# Patient Record
Sex: Female | Born: 1940 | Race: Black or African American | Hispanic: No | Smoking: Never smoker
Health system: Southern US, Community
[De-identification: ages and names within clinical notes are randomized; demographics above are authoritative.]

## PROBLEM LIST (undated history)

## (undated) DIAGNOSIS — I10 Essential (primary) hypertension: Secondary | ICD-10-CM

## (undated) DIAGNOSIS — E119 Type 2 diabetes mellitus without complications: Secondary | ICD-10-CM

---

## 2015-04-18 ENCOUNTER — Emergency Department (HOSPITAL_COMMUNITY): Payer: Medicare Other

## 2015-04-18 ENCOUNTER — Encounter (HOSPITAL_COMMUNITY): Payer: Self-pay | Admitting: Emergency Medicine

## 2015-04-18 ENCOUNTER — Emergency Department (HOSPITAL_COMMUNITY)
Admission: EM | Admit: 2015-04-18 | Discharge: 2015-04-18 | Disposition: A | Discharge status: Home / Self Care | Payer: Medicare Other | Attending: Emergency Medicine | Admitting: Emergency Medicine

## 2015-04-18 DIAGNOSIS — J111 Influenza due to unidentified influenza virus with other respiratory manifestations: Secondary | ICD-10-CM

## 2015-04-18 DIAGNOSIS — E119 Type 2 diabetes mellitus without complications: Secondary | ICD-10-CM | POA: Diagnosis not present

## 2015-04-18 DIAGNOSIS — R05 Cough: Secondary | ICD-10-CM | POA: Diagnosis not present

## 2015-04-18 DIAGNOSIS — Z79899 Other long term (current) drug therapy: Secondary | ICD-10-CM | POA: Insufficient documentation

## 2015-04-18 DIAGNOSIS — R197 Diarrhea, unspecified: Secondary | ICD-10-CM | POA: Insufficient documentation

## 2015-04-18 DIAGNOSIS — R067 Sneezing: Secondary | ICD-10-CM | POA: Diagnosis not present

## 2015-04-18 DIAGNOSIS — R69 Illness, unspecified: Secondary | ICD-10-CM

## 2015-04-18 DIAGNOSIS — I10 Essential (primary) hypertension: Secondary | ICD-10-CM | POA: Insufficient documentation

## 2015-04-18 DIAGNOSIS — R509 Fever, unspecified: Secondary | ICD-10-CM | POA: Insufficient documentation

## 2015-04-18 DIAGNOSIS — R112 Nausea with vomiting, unspecified: Secondary | ICD-10-CM | POA: Insufficient documentation

## 2015-04-18 HISTORY — DX: Essential (primary) hypertension: I10

## 2015-04-18 HISTORY — DX: Type 2 diabetes mellitus without complications: E11.9

## 2015-04-18 LAB — COMPREHENSIVE METABOLIC PANEL
ALBUMIN: 4.3 g/dL (ref 3.5–5.0)
ALT: 15 U/L (ref 14–54)
ANION GAP: 9 (ref 5–15)
AST: 17 U/L (ref 15–41)
Alkaline Phosphatase: 76 U/L (ref 38–126)
BILIRUBIN TOTAL: 0.8 mg/dL (ref 0.3–1.2)
BUN: 14 mg/dL (ref 6–20)
CO2: 25 mmol/L (ref 22–32)
Calcium: 9.3 mg/dL (ref 8.9–10.3)
Chloride: 107 mmol/L (ref 101–111)
Creatinine, Ser: 1.01 mg/dL — ABNORMAL HIGH (ref 0.44–1.00)
GFR calc Af Amer: >60 mL/min (ref >60)
GFR calc non Af Amer: 53 mL/min — ABNORMAL LOW (ref >60)
GLUCOSE: 208 mg/dL — AB (ref 65–99)
POTASSIUM: 3.6 mmol/L (ref 3.5–5.1)
SODIUM: 141 mmol/L (ref 135–145)
TOTAL PROTEIN: 7.5 g/dL (ref 6.5–8.1)

## 2015-04-18 LAB — CBC
HEMATOCRIT: 36.3 % (ref 36.0–46.0)
HEMOGLOBIN: 11.7 g/dL — AB (ref 12.0–15.0)
MCH: 26.4 pg (ref 26.0–34.0)
MCHC: 32.2 g/dL (ref 30.0–36.0)
MCV: 81.9 fL (ref 78.0–100.0)
Platelets: 204 10*3/uL (ref 150–400)
RBC: 4.43 MIL/uL (ref 3.87–5.11)
RDW: 14 % (ref 11.5–15.5)
WBC: 8.8 10*3/uL (ref 4.0–10.5)

## 2015-04-18 LAB — URINE MICROSCOPIC-ADD ON

## 2015-04-18 LAB — URINALYSIS, ROUTINE W REFLEX MICROSCOPIC
BILIRUBIN URINE: NEGATIVE
Glucose, UA: 500 mg/dL — AB
Ketones, ur: 15 mg/dL — AB
Leukocytes, UA: NEGATIVE
NITRITE: NEGATIVE
PH: 5.5 (ref 5.0–8.0)
Protein, ur: NEGATIVE mg/dL
SPECIFIC GRAVITY, URINE: 1.016 (ref 1.005–1.030)

## 2015-04-18 LAB — LIPASE, BLOOD: Lipase: 20 U/L (ref 11–51)

## 2015-04-18 LAB — I-STAT CG4 LACTIC ACID, ED: Lactic Acid, Venous: 1.25 mmol/L (ref 0.5–2.0)

## 2015-04-18 MED ORDER — SODIUM CHLORIDE 0.9 % IV BOLUS (SEPSIS)
500.0000 mL | Freq: Once | INTRAVENOUS | Status: AC
  Administered 2015-04-18: 500 mL via INTRAVENOUS

## 2015-04-18 MED ORDER — ONDANSETRON 8 MG PO TBDP: 8.0000 mg | ORAL_TABLET | Freq: Three times a day (TID) | ORAL | Status: AC | PRN

## 2015-04-18 MED ORDER — ACETAMINOPHEN 325 MG PO TABS
650.0000 mg | ORAL_TABLET | Freq: Once | ORAL | Status: AC
  Administered 2015-04-18: 650 mg via ORAL
  Filled 2015-04-18: qty 2

## 2015-04-18 MED ORDER — HYDROCOD POLST-CPM POLST ER 10-8 MG/5ML PO SUER: 5.0000 mL | Freq: Two times a day (BID) | ORAL | Status: AC

## 2015-04-18 NOTE — ED Notes (Signed)
Patient cannot use restroom at this time, aware specimen is needed.

## 2015-04-18 NOTE — Discharge Instructions (Signed)

## 2015-04-18 NOTE — ED Provider Notes (Signed)
CSN: [id number]     Arrival date & time 04/18/15  [id number] History   First MD Initiated Contact with Patient 04/18/15 [phone]     Chief Complaint  Patient presents with  . Emesis     (Consider location/radiation/quality/duration/timing/severity/associated sxs/prior Treatment) HPI 74 year old female history of diabetes and hypertension presents today with 2 day history of nausea, vomiting, diarrhea, sneezing, cough with influenza-like illness. She reports that her symptoms began on Tuesday after her son symptoms began the day before. She has a history of diabetes and her blood sugars have been running "high" with this. She has no vomited since yesterday. Emesis appeared to be consistent with food recently eaten. She states that she had 2 loose stools there was no blood noted. She has had subjective fever. Today she has not vomited but continues to have nausea. Very little fluids due to the nausea. She feels somewhat dyspneic. She is visiting here from out of town. She has not had a flu shot this year. She denies smoking. Past Medical History  Diagnosis Date  . Diabetes mellitus without complication (HCC)   . Hypertension    Past Surgical History  Procedure Laterality Date  . Cesarean section     No family history on file. Social History  Substance Use Topics  . Smoking status: Never Smoker   . Smokeless tobacco: None  . Alcohol Use: No   OB History    No data available     Review of Systems  All other systems reviewed and are negative.     Allergies  Penicillins  Home Medications   Prior to Admission medications   Medication Sig Start Date End Date Taking? Authorizing Provider  acetaminophen (TYLENOL) 500 MG tablet Take 500 mg by mouth every 6 (six) hours as needed for moderate pain.   Yes Historical Provider, MD  glipiZIDE (GLUCOTROL XL) 10 MG 24 hr tablet Take 10 mg by mouth 2 (two) times daily. 03/07/15  Yes Historical Provider, MD  hydrochlorothiazide (HYDRODIURIL) 12.5 MG  tablet Take 12.5 mg by mouth daily. 03/22/15  Yes Historical Provider, MD   BP 139/73 mmHg  Pulse 90  Temp(Src) 102.4 F (39.1 C) (Oral)  Resp 18  SpO2 98% Physical Exam  Constitutional: She is oriented to person, place, and time. She appears well-nourished.  Febrile to 102.4  HENT:  Head: Normocephalic and atraumatic.  Right Ear: External ear normal.  Left Ear: External ear normal.  Nose: Nose normal.  Mouth/Throat: Oropharynx is clear and moist.  Eyes: Conjunctivae and EOM are normal. Pupils are equal, round, and reactive to light.  Neck: Normal range of motion. Neck supple.  Cardiovascular: Normal rate, regular rhythm, normal heart sounds and intact distal pulses.   Pulmonary/Chest: Effort normal and breath sounds normal.  Abdominal: Soft. Bowel sounds are normal.  Musculoskeletal: Normal range of motion.  Neurological: She is alert and oriented to person, place, and time. She has normal reflexes.  Skin: Skin is warm and dry.  Psychiatric: She has a normal mood and affect. Her behavior is normal. Judgment and thought content normal.  Nursing note and vitals reviewed.   ED Course  Procedures (including critical care time) Labs Review Labs Reviewed  CBC - Abnormal; Notable for the following:    Hemoglobin 11.7 (*)    All other components within normal limits  CULTURE, BLOOD (ROUTINE X 2)  CULTURE, BLOOD (ROUTINE X 2)  LIPASE, BLOOD  COMPREHENSIVE METABOLIC PANEL  URINALYSIS, ROUTINE W REFLEX MICROSCOPIC (NOT AT [facility])  I-STAT  CG4 LACTIC ACID, ED    Imaging Review Dg Chest Portable 1 View  04/18/2015  CLINICAL DATA:  Cough, fever nonsmoker EXAM: PORTABLE CHEST 1 VIEW COMPARISON:  None. FINDINGS: Cardiomediastinal silhouette is unremarkable. Mild upper thoracic dextroscoliosis. Mild degenerative changes thoracic spine. No acute infiltrate or pulmonary edema. IMPRESSION: No active disease.  Mild degenerative changes thoracic spine. Electronically Signed   By: Natasha Mead  M.D.   On: 04/18/2015 10:25   I have personally reviewed and evaluated these images and lab results as part of my medical decision-making.   EKG Interpretation   Date/Time:  Monday April 18 2015 10:41:24 EST Ventricular Rate:  82 PR Interval:    QRS Duration: 86 QT Interval:  301 QTC Calculation: 351 R Axis:   56 Text Interpretation:  Normal sinus rhythm Nonspecific ST abnormality  Confirmed by Alora Gorey MD, Makinzie Considine ([id number]) on 04/18/2015 10:46:17 AM      MDM   Final diagnoses:  Influenza-like illness    Temp down to 100.  Patient tolerating liquids well.  No infiltrate on cxr.  Plan antiemetics and cough medicine.  Patient voices understanding of return precautions and need for follow u.     Margarita Grizzle, MD 04/18/15 1225

## 2015-04-18 NOTE — ED Notes (Signed)
Bed: [id number] Expected date:  Expected time:  Means of arrival:  Comments: 74 y/o F EMS

## 2015-04-18 NOTE — ED Notes (Signed)
Per pt, states she went to a retirement party on Saturday-returned home and started feeling bad-vomiting, diarrhea

## 2015-04-23 LAB — CULTURE, BLOOD (ROUTINE X 2)
CULTURE: NO GROWTH
Culture: NO GROWTH

## 2016-08-12 IMAGING — CR DG CHEST 1V PORT
1 series · 1 of 1 positions shown · non-contrast
Comparison: None.

CLINICAL DATA: Cough, fever nonsmoker

EXAM:
PORTABLE CHEST 1 VIEW

[AP]
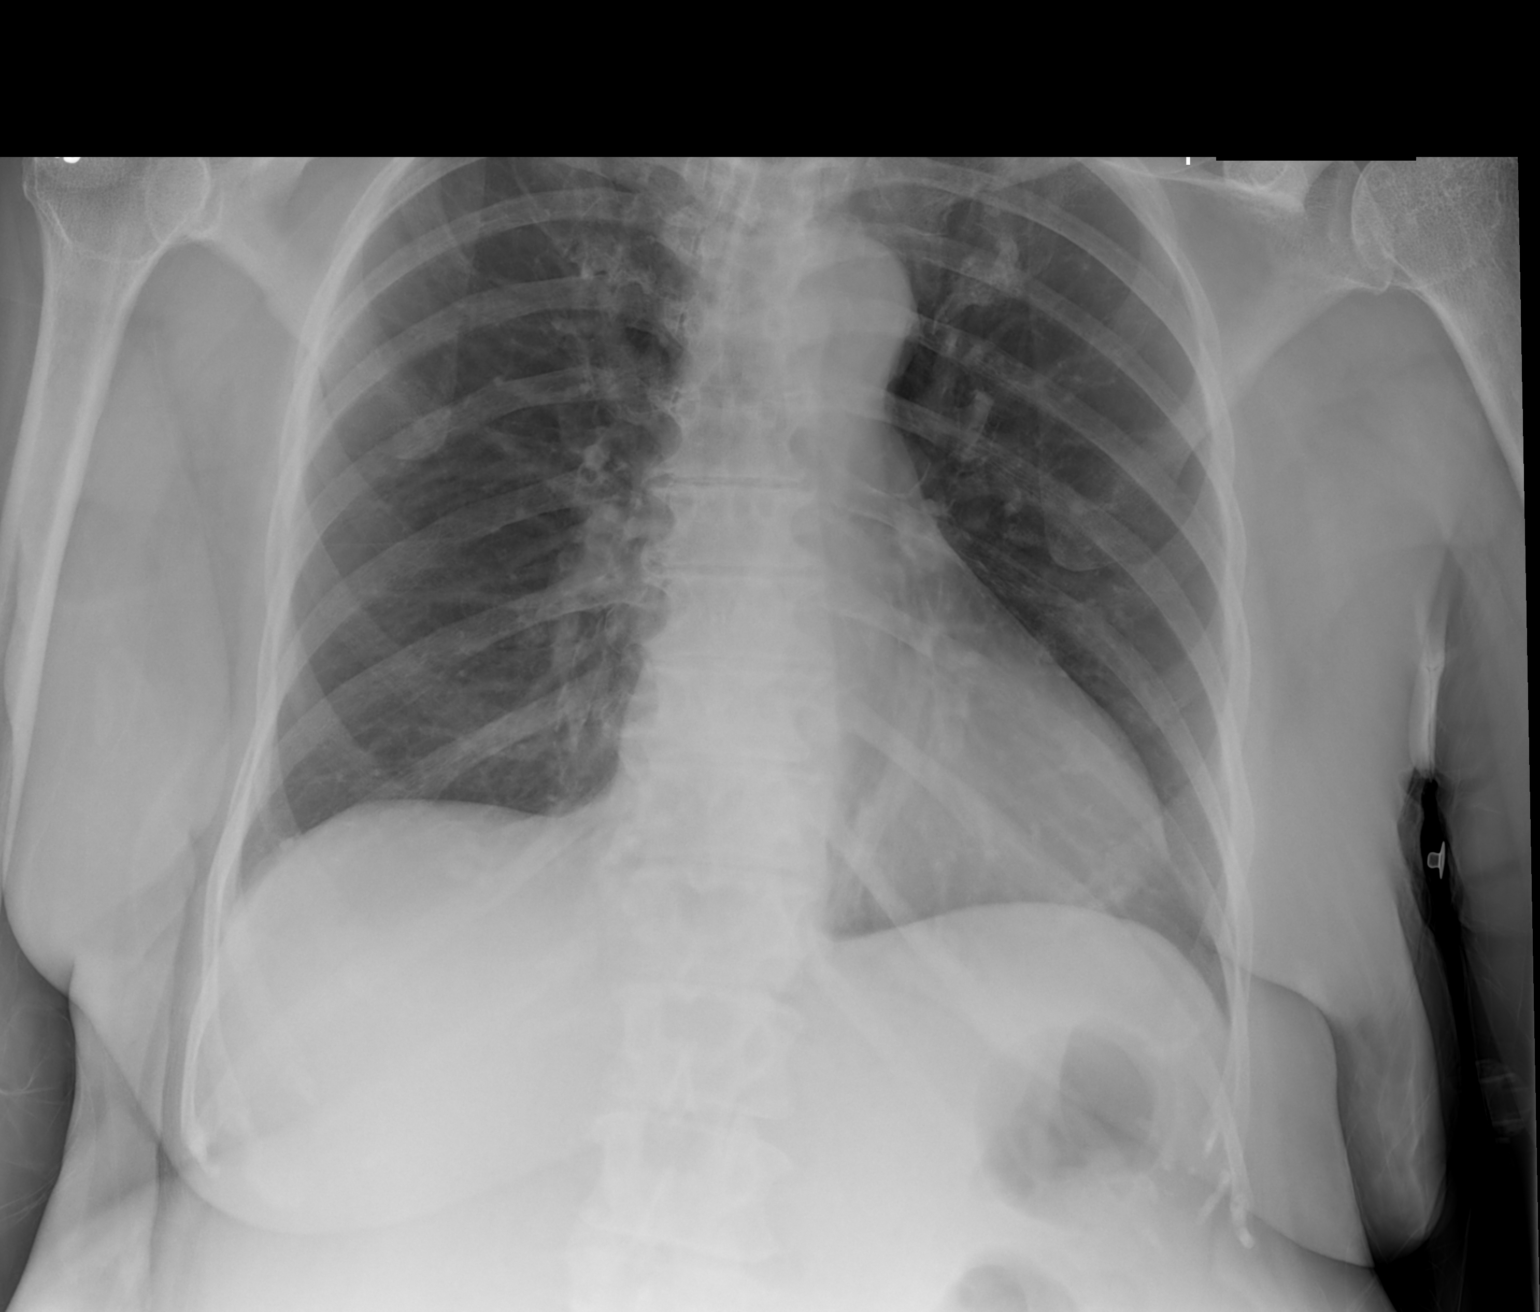

[1 of 1 positions shown; findings below may reference images not displayed]

FINDINGS: Cardiomediastinal silhouette is unremarkable. Mild upper thoracic
dextroscoliosis. Mild degenerative changes thoracic spine. No acute
infiltrate or pulmonary edema.
IMPRESSION: No active disease.  Mild degenerative changes thoracic spine.
